# Patient Record
Sex: Male | Born: 1960 | Race: White | Marital: Married | Smoking: Former smoker
Health system: Southern US, Community
[De-identification: ages and names within clinical notes are randomized; demographics above are authoritative.]

## PROBLEM LIST (undated history)

## (undated) DIAGNOSIS — K2270 Barrett's esophagus without dysplasia: Secondary | ICD-10-CM

## (undated) DIAGNOSIS — I10 Essential (primary) hypertension: Secondary | ICD-10-CM

## (undated) DIAGNOSIS — E669 Obesity, unspecified: Secondary | ICD-10-CM

## (undated) DIAGNOSIS — E785 Hyperlipidemia, unspecified: Secondary | ICD-10-CM

## (undated) HISTORY — DX: Barrett's esophagus without dysplasia: K22.70

## (undated) HISTORY — DX: Gilbert syndrome: E80.4

## (undated) HISTORY — DX: Hyperlipidemia, unspecified: E78.5

## (undated) HISTORY — DX: Obesity, unspecified: E66.9

## (undated) HISTORY — DX: Essential (primary) hypertension: I10

---

## 2013-11-28 ENCOUNTER — Other Ambulatory Visit: Payer: Self-pay | Admitting: Gastroenterology

## 2013-11-28 DIAGNOSIS — R748 Abnormal levels of other serum enzymes: Secondary | ICD-10-CM

## 2013-12-04 ENCOUNTER — Ambulatory Visit
Admission: RE | Admit: 2013-12-04 | Discharge: 2013-12-04 | Disposition: A | Discharge status: Home / Self Care | Payer: Managed Care, Other (non HMO) | Source: Ambulatory Visit | Attending: Gastroenterology | Admitting: Gastroenterology

## 2013-12-04 DIAGNOSIS — R748 Abnormal levels of other serum enzymes: Secondary | ICD-10-CM

## 2015-11-19 ENCOUNTER — Other Ambulatory Visit: Payer: Self-pay | Admitting: Orthopedic Surgery

## 2015-11-19 DIAGNOSIS — M546 Pain in thoracic spine: Secondary | ICD-10-CM

## 2015-11-19 DIAGNOSIS — M545 Low back pain: Secondary | ICD-10-CM

## 2015-11-27 ENCOUNTER — Ambulatory Visit
Admission: RE | Admit: 2015-11-27 | Discharge: 2015-11-27 | Disposition: A | Discharge status: Home / Self Care | Payer: Managed Care, Other (non HMO) | Source: Ambulatory Visit | Attending: Orthopedic Surgery | Admitting: Orthopedic Surgery

## 2015-11-27 ENCOUNTER — Other Ambulatory Visit: Payer: Self-pay | Admitting: Orthopedic Surgery

## 2015-11-27 DIAGNOSIS — Z77018 Contact with and (suspected) exposure to other hazardous metals: Secondary | ICD-10-CM

## 2015-11-27 DIAGNOSIS — M545 Low back pain: Secondary | ICD-10-CM

## 2015-11-27 DIAGNOSIS — M546 Pain in thoracic spine: Secondary | ICD-10-CM

## 2016-12-15 IMAGING — MR MR LUMBAR SPINE W/O CM
4 of 5 series · 26 of 48 positions shown · non-contrast
Comparison: None.

CLINICAL DATA: Low back pain without sciatica.

EXAM:
MRI LUMBAR SPINE WITHOUT CONTRAST
TECHNIQUE: Multiplanar, multisequence MR imaging of the lumbar spine was
performed. No intravenous contrast was administered.

[Series 3: T2 · sagittal · 4.0mm · 0.55mm/px · 6 of 13 slices shown (1 of 2)]
[im 1/13]
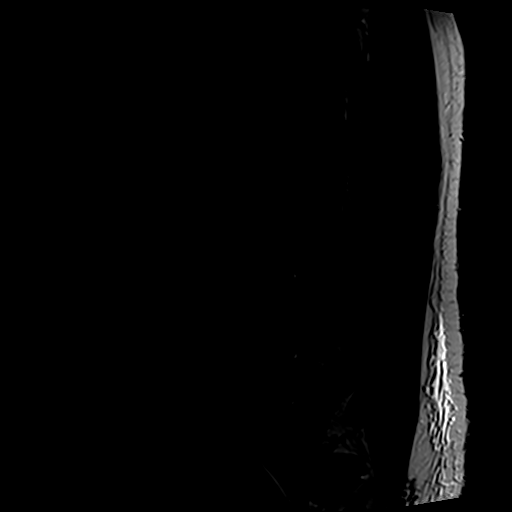
[im 3/13]
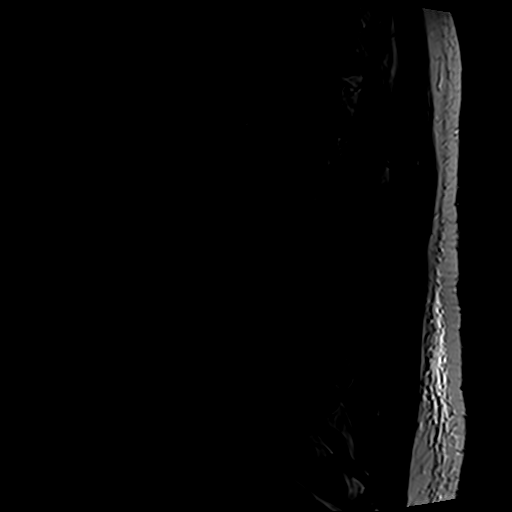
[im 5/13]
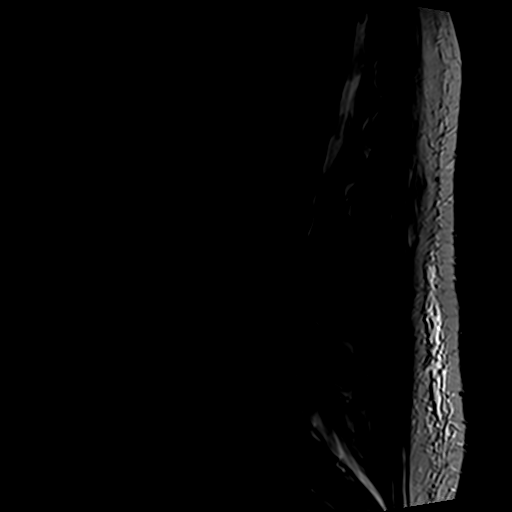
[im 8/13]
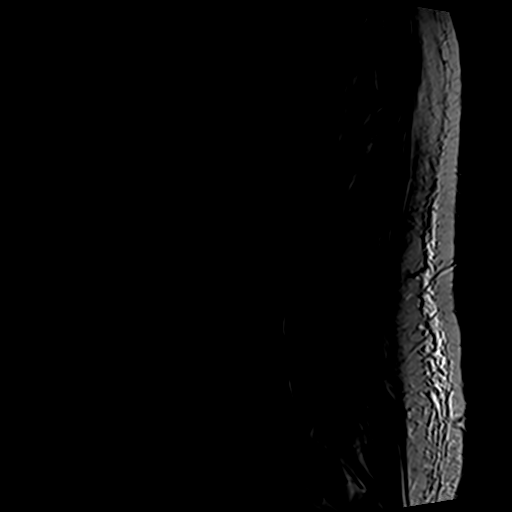
[im 10/13]
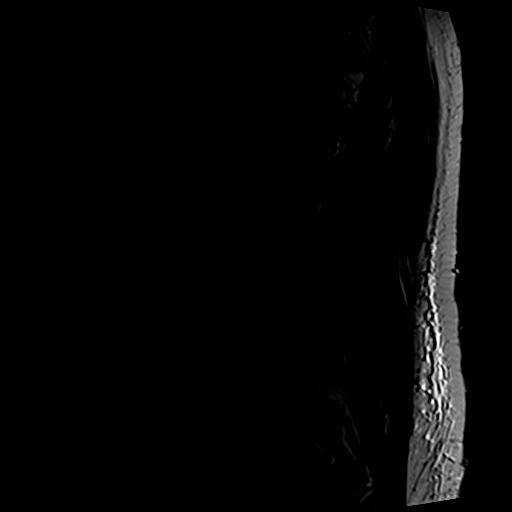
[im 13/13]
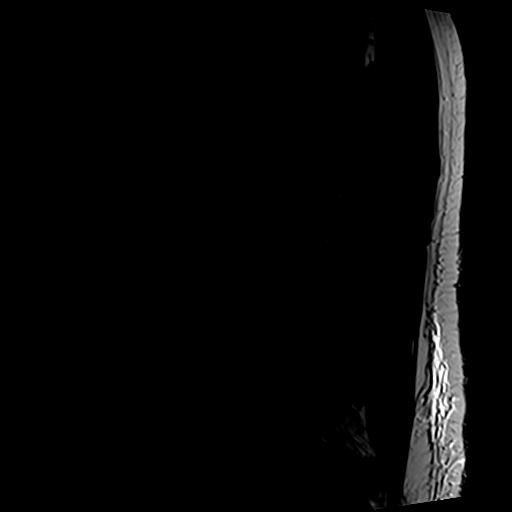

[Series 5: T1 · sagittal · 4.0mm · 0.55mm/px · 6 of 13 slices shown (1 of 2)]
[im 1/13]
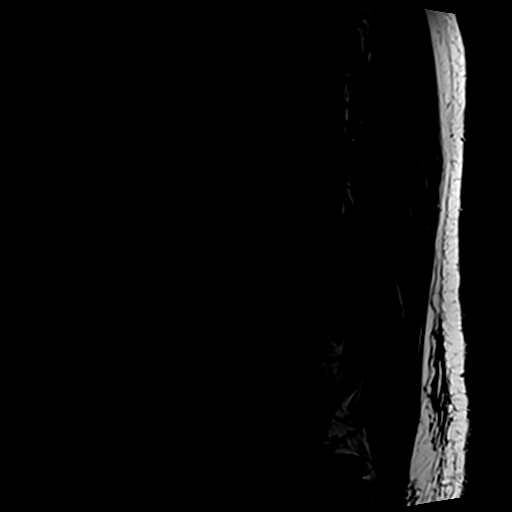
[im 3/13]
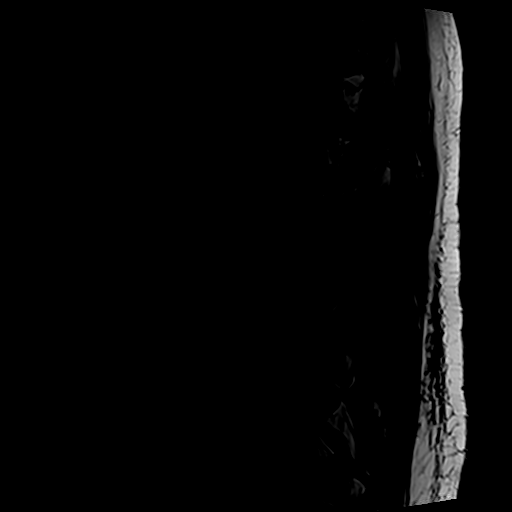
[im 5/13]
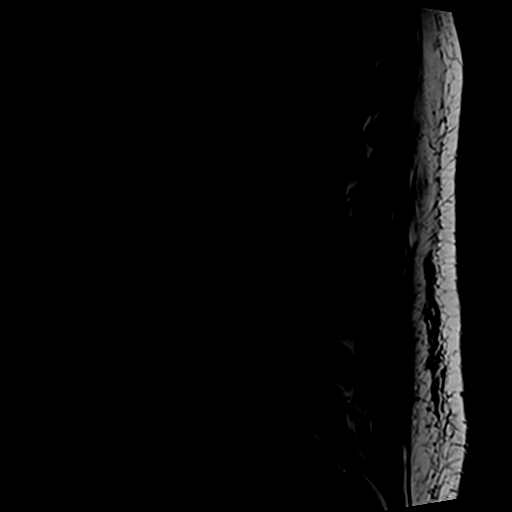
[im 8/13]
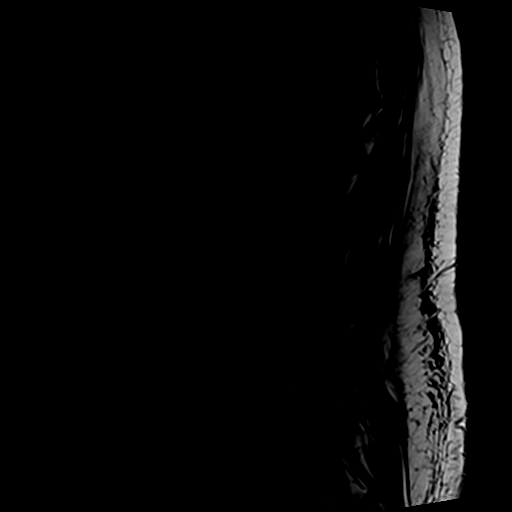
[im 10/13]
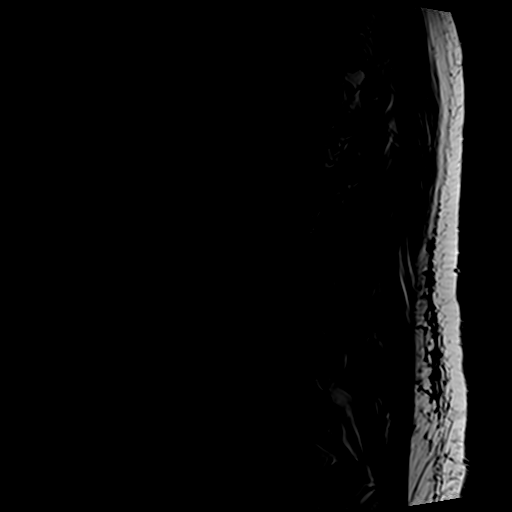
[im 13/13]
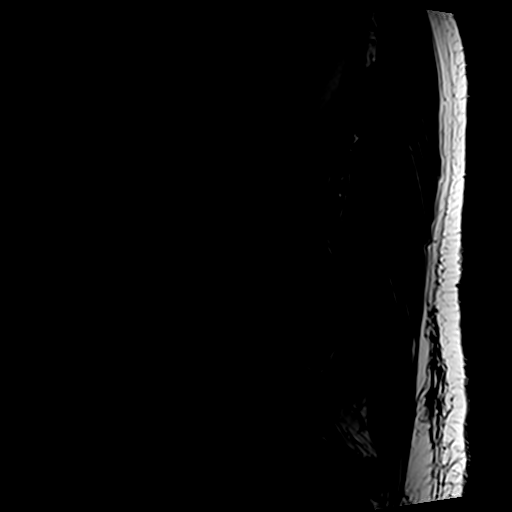

[Series 6: T1 · axial · 4.0mm · 0.35mm/px · z∈[-27,+132]mm · 5 of 34 slices shown (2 of 2)]
[im 1/34]
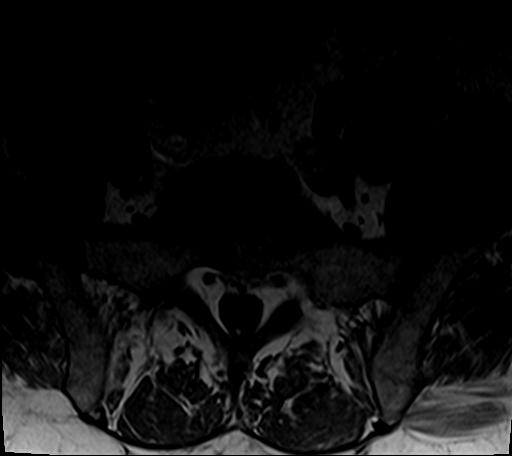
[im 5/34]
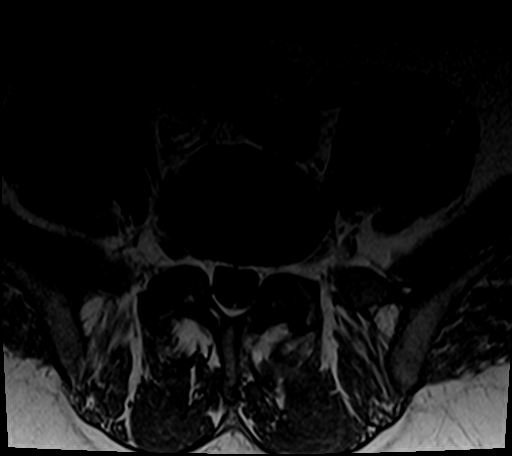
[im 10/34]
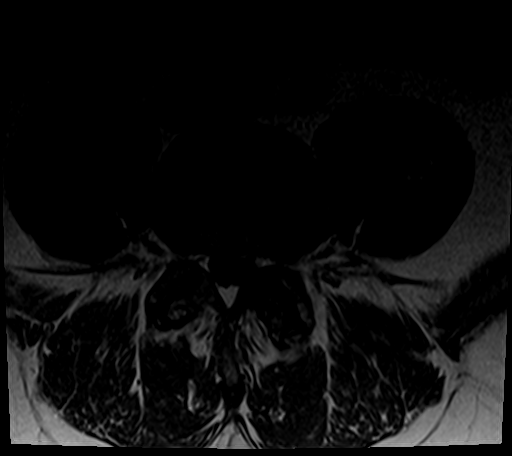
[im 17/34]
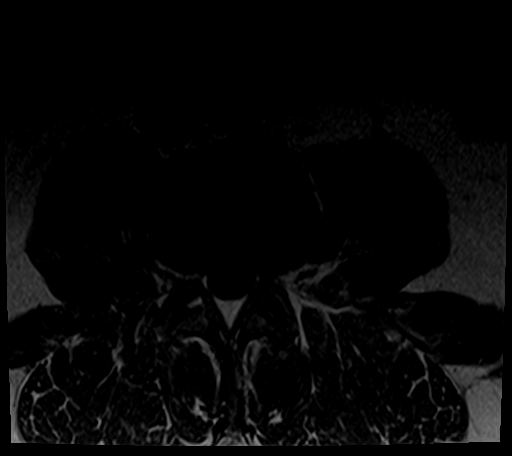
[im 29/34]
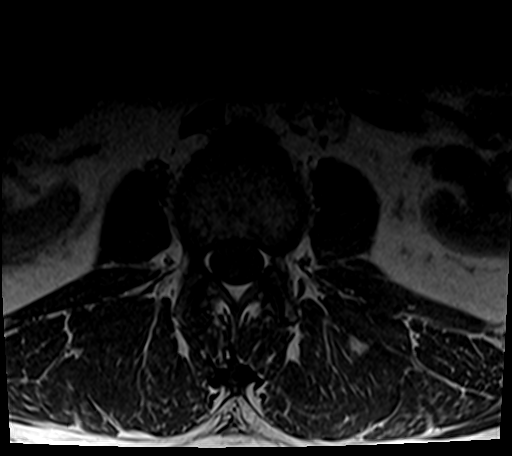

[Series 7: T2 · axial · 4.0mm · 0.70mm/px · z∈[-27,+158]mm · 9 of 34 slices shown (2 of 2)]
[im 1/34]
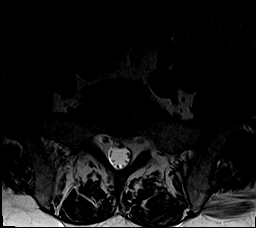
[im 5/34]
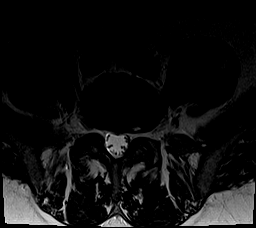
[im 10/34]
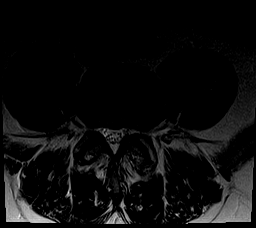
[im 15/34]
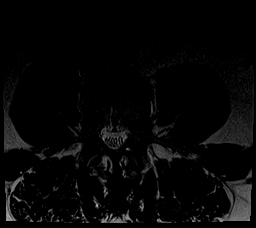
[im 17/34]
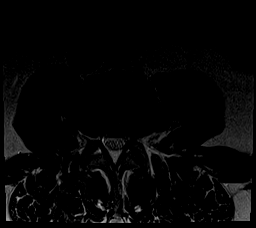
[im 19/34]
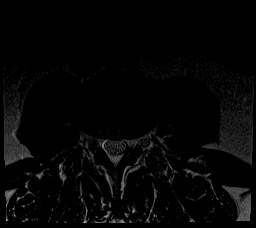
[im 24/34]
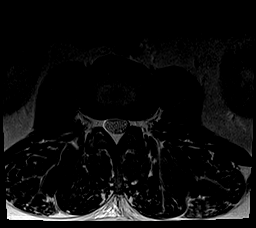
[im 29/34]
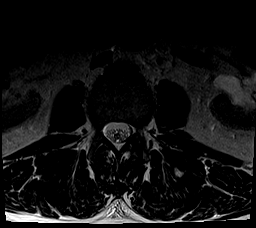
[im 34/34]
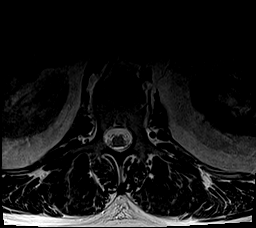

[26 of 48 positions shown; findings below may reference images not displayed]

FINDINGS: Segmentation:  Normal segmentation.  Lowest disc space L5-S1

Alignment: Mild retrolisthesis L3-4. Slight anterior listhesis L4-5

Vertebrae: Negative for fracture. Negative for mass or metastatic
disease. Normal appearing bone marrow.

Conus medullaris: Extends to the L2-3 level and appears normal.

Paraspinal and other soft tissues: Paraspinous muscles are well
developed and symmetric. No retroperitoneal mass or adenopathy.
Parapelvic cysts in the left kidney.

Disc levels:

L1-2:  Negative

L2-3:  Mild disc bulging without stenosis

L3-4: Mild disc bulging with mild spinal stenosis. Mild facet
degeneration

L4-5: Mild anterior listhesis. Moderate facet degeneration. Small
synovial cyst on the left indenting the posterior thecal sac on the
left. Moderate foraminal stenosis. Diffuse disc bulging. Small left
foraminal disc protrusion with mild left foraminal narrowing.

L5-S1: Annular fissure on the left without significant disc
protrusion. Mild facet degeneration. No significant stenosis
IMPRESSION: Mild spinal stenosis L3-4

Moderate spinal stenosis L4-5 with disc and facet degeneration.
Small synovial cyst on the left. Small left foraminal disc
protrusion displacing the left L4 nerve root.

## 2016-12-15 IMAGING — CR DG ORBITS FOR FOREIGN BODY
2 series · 2 of 2 positions shown · non-contrast
Comparison: None.

CLINICAL DATA: MRI clearance. History of metal removal from right
eye.

EXAM:
ORBITS FOR FOREIGN BODY - 2 VIEW

[w orbit pa (1 of 2)]
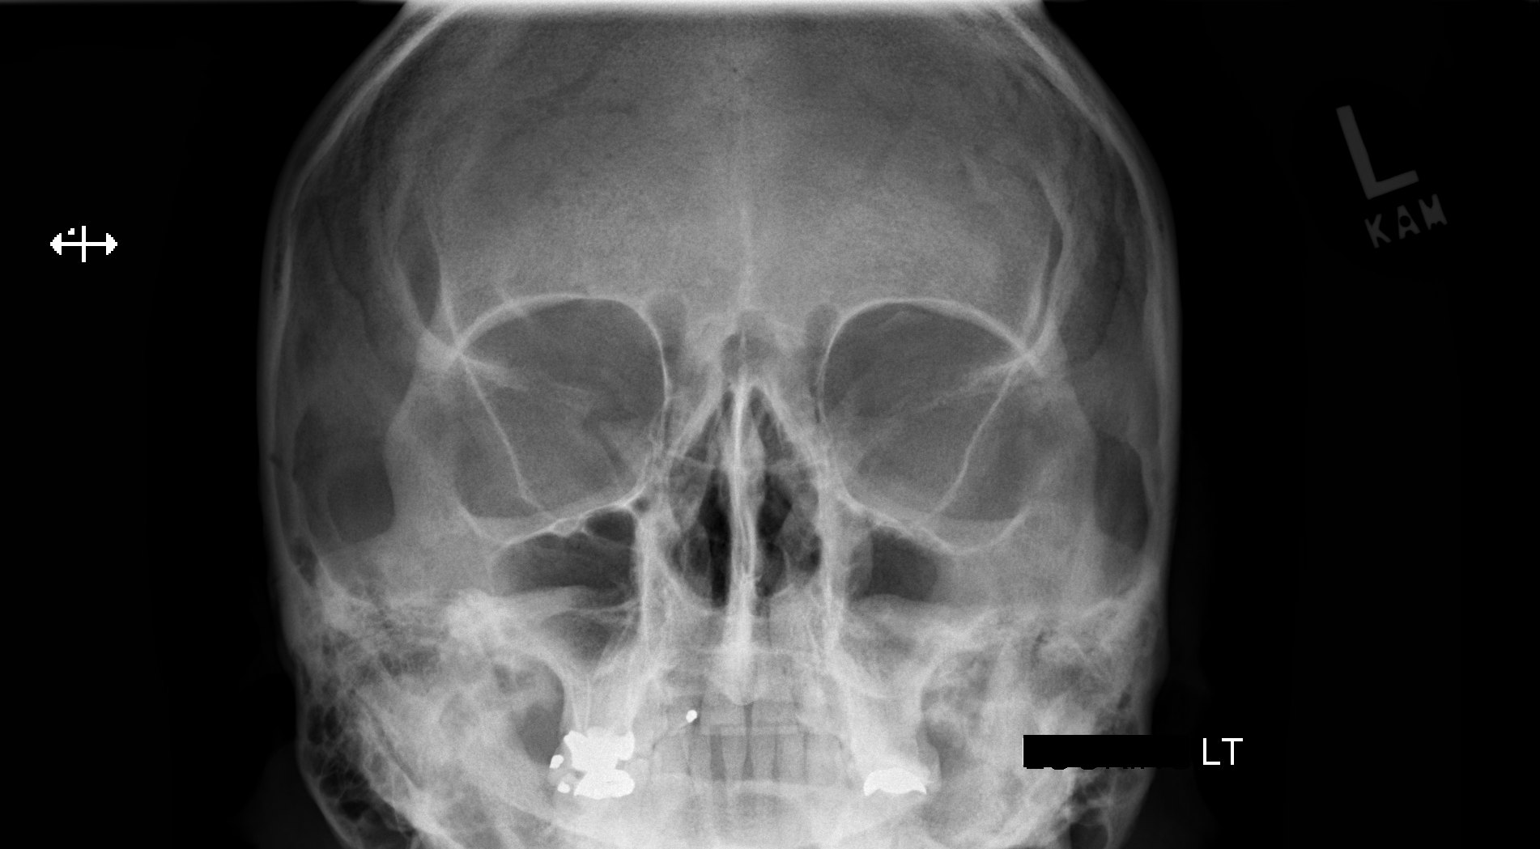

[w orbit pa (2 of 2)]
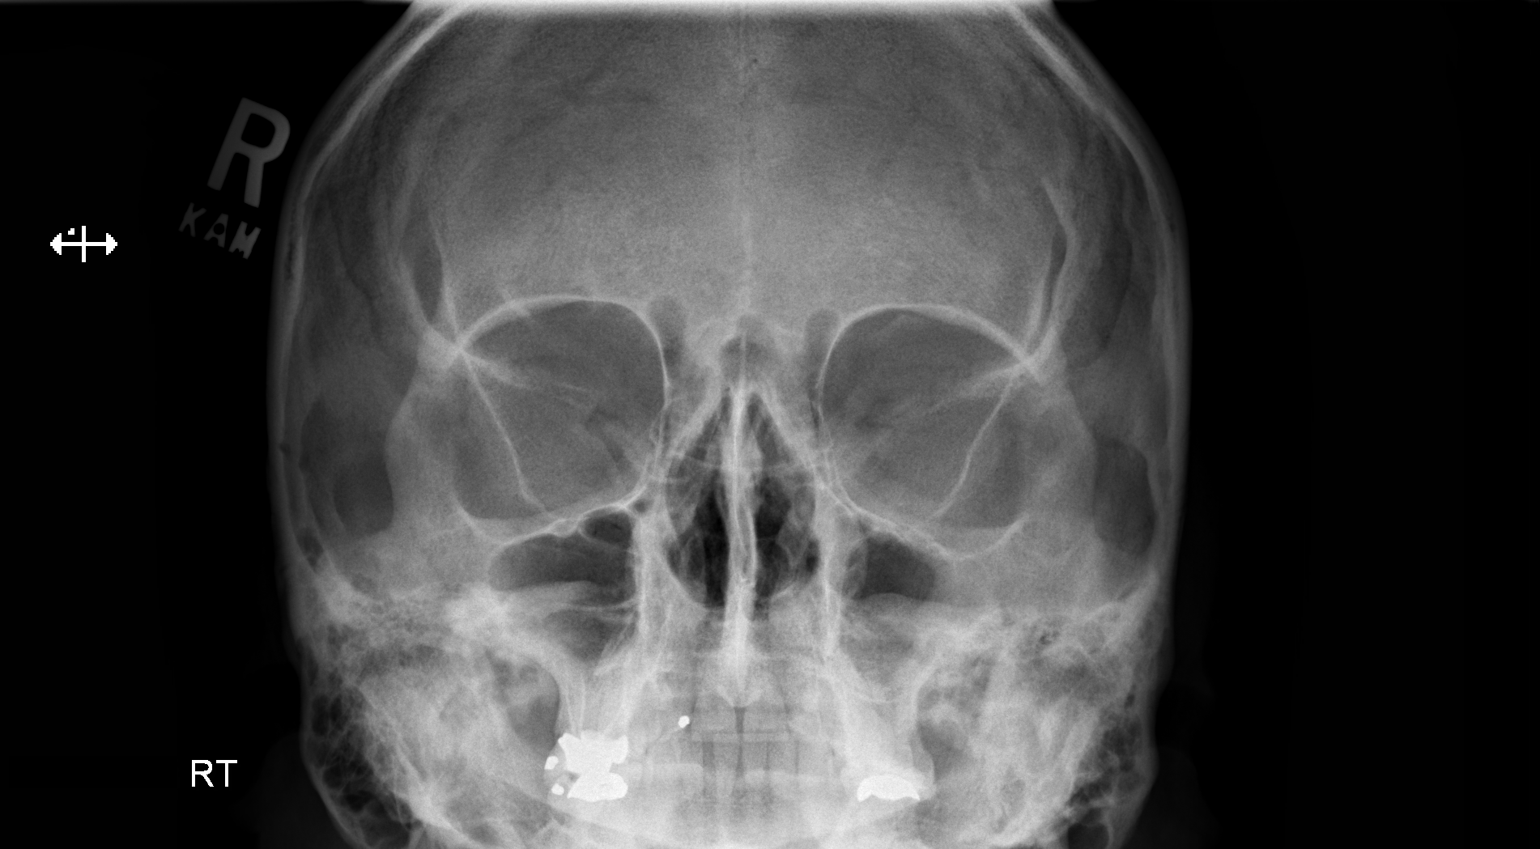

[2 of 2 positions shown; findings below may reference images not displayed]

FINDINGS: There is no evidence of metallic foreign body within the orbits. No
significant bone abnormality identified. Bilateral dental fillings.
IMPRESSION: No evidence of metallic foreign body within the orbits.

## 2019-01-25 ENCOUNTER — Other Ambulatory Visit: Payer: Self-pay

## 2019-01-25 DIAGNOSIS — Z20822 Contact with and (suspected) exposure to covid-19: Secondary | ICD-10-CM

## 2019-01-26 LAB — NOVEL CORONAVIRUS, NAA: SARS-CoV-2, NAA: NOT DETECTED
